# Patient Record
Sex: Male | Born: 1950 | Race: White | Hispanic: No | Marital: Married | Smoking: Never smoker
Health system: Southern US, Community
[De-identification: ages and names within clinical notes are randomized; demographics above are authoritative.]

## PROBLEM LIST (undated history)

## (undated) DIAGNOSIS — R7989 Other specified abnormal findings of blood chemistry: Secondary | ICD-10-CM

## (undated) DIAGNOSIS — R5383 Other fatigue: Secondary | ICD-10-CM

## (undated) DIAGNOSIS — B54 Unspecified malaria: Secondary | ICD-10-CM

## (undated) DIAGNOSIS — R972 Elevated prostate specific antigen [PSA]: Secondary | ICD-10-CM

## (undated) DIAGNOSIS — I471 Supraventricular tachycardia, unspecified: Secondary | ICD-10-CM

## (undated) DIAGNOSIS — E785 Hyperlipidemia, unspecified: Secondary | ICD-10-CM

## (undated) DIAGNOSIS — K219 Gastro-esophageal reflux disease without esophagitis: Secondary | ICD-10-CM

## (undated) DIAGNOSIS — I10 Essential (primary) hypertension: Secondary | ICD-10-CM

## (undated) DIAGNOSIS — I739 Peripheral vascular disease, unspecified: Secondary | ICD-10-CM

## (undated) DIAGNOSIS — I499 Cardiac arrhythmia, unspecified: Secondary | ICD-10-CM

## (undated) DIAGNOSIS — R001 Bradycardia, unspecified: Secondary | ICD-10-CM

## (undated) DIAGNOSIS — K828 Other specified diseases of gallbladder: Secondary | ICD-10-CM

## (undated) DIAGNOSIS — R609 Edema, unspecified: Secondary | ICD-10-CM

## (undated) DIAGNOSIS — M545 Low back pain, unspecified: Secondary | ICD-10-CM

## (undated) DIAGNOSIS — J189 Pneumonia, unspecified organism: Secondary | ICD-10-CM

## (undated) DIAGNOSIS — R945 Abnormal results of liver function studies: Secondary | ICD-10-CM

## (undated) HISTORY — PX: CYSTOSCOPY: SUR368

## (undated) HISTORY — DX: Bradycardia, unspecified: R00.1

## (undated) HISTORY — DX: Abnormal results of liver function studies: R94.5

## (undated) HISTORY — DX: Gastro-esophageal reflux disease without esophagitis: K21.9

## (undated) HISTORY — DX: Edema, unspecified: R60.9

## (undated) HISTORY — PX: COLONOSCOPY: SHX174

## (undated) HISTORY — DX: Other fatigue: R53.83

## (undated) HISTORY — PX: VASECTOMY: SHX75

## (undated) HISTORY — DX: Hyperlipidemia, unspecified: E78.5

## (undated) HISTORY — PX: SHOULDER ARTHROSCOPY: SHX128

## (undated) HISTORY — DX: Other specified diseases of gallbladder: K82.8

## (undated) HISTORY — DX: Supraventricular tachycardia, unspecified: I47.10

## (undated) HISTORY — DX: Supraventricular tachycardia: I47.1

## (undated) HISTORY — DX: Other specified abnormal findings of blood chemistry: R79.89

## (undated) HISTORY — DX: Unspecified malaria: B54

## (undated) HISTORY — PX: CARDIAC CATHETERIZATION: SHX172

---

## 1998-06-24 ENCOUNTER — Ambulatory Visit (HOSPITAL_COMMUNITY): Admission: RE | Admit: 1998-06-24 | Discharge: 1998-06-24 | Payer: Self-pay | Admitting: Orthopedic Surgery

## 1998-06-25 ENCOUNTER — Encounter: Payer: Self-pay | Admitting: Orthopedic Surgery

## 1998-06-26 ENCOUNTER — Encounter: Payer: Self-pay | Admitting: Orthopedic Surgery

## 1998-06-26 ENCOUNTER — Ambulatory Visit (HOSPITAL_COMMUNITY): Admission: RE | Admit: 1998-06-26 | Discharge: 1998-06-26 | Payer: Self-pay | Admitting: Orthopedic Surgery

## 1998-11-27 ENCOUNTER — Ambulatory Visit (HOSPITAL_COMMUNITY): Admission: RE | Admit: 1998-11-27 | Discharge: 1998-11-27 | Payer: Self-pay | Admitting: Radiology

## 1998-11-27 ENCOUNTER — Encounter: Payer: Self-pay | Admitting: Radiology

## 2001-02-28 ENCOUNTER — Ambulatory Visit (HOSPITAL_COMMUNITY): Admission: RE | Admit: 2001-02-28 | Discharge: 2001-02-28 | Payer: Self-pay | Admitting: Gastroenterology

## 2001-07-20 ENCOUNTER — Ambulatory Visit (HOSPITAL_COMMUNITY): Admission: RE | Admit: 2001-07-20 | Discharge: 2001-07-20 | Payer: Self-pay | Admitting: Orthopedic Surgery

## 2001-07-20 ENCOUNTER — Encounter: Payer: Self-pay | Admitting: Orthopedic Surgery

## 2002-09-18 ENCOUNTER — Encounter: Payer: Self-pay | Admitting: Cardiology

## 2002-09-18 ENCOUNTER — Observation Stay (HOSPITAL_COMMUNITY): Admission: EM | Admit: 2002-09-18 | Discharge: 2002-09-19 | Payer: Self-pay | Admitting: Emergency Medicine

## 2002-09-19 ENCOUNTER — Encounter (INDEPENDENT_AMBULATORY_CARE_PROVIDER_SITE_OTHER): Payer: Self-pay | Admitting: *Deleted

## 2003-08-04 ENCOUNTER — Encounter: Admission: RE | Admit: 2003-08-04 | Discharge: 2003-08-04 | Payer: Self-pay | Admitting: Internal Medicine

## 2004-11-17 ENCOUNTER — Ambulatory Visit (HOSPITAL_COMMUNITY): Admission: RE | Admit: 2004-11-17 | Discharge: 2004-11-17 | Payer: Self-pay | Admitting: Orthopedic Surgery

## 2005-01-07 ENCOUNTER — Ambulatory Visit (HOSPITAL_COMMUNITY): Admission: RE | Admit: 2005-01-07 | Discharge: 2005-01-07 | Payer: Self-pay | Admitting: Orthopedic Surgery

## 2005-01-07 ENCOUNTER — Ambulatory Visit (HOSPITAL_BASED_OUTPATIENT_CLINIC_OR_DEPARTMENT_OTHER): Admission: RE | Admit: 2005-01-07 | Discharge: 2005-01-07 | Payer: Self-pay | Admitting: Orthopedic Surgery

## 2006-04-05 ENCOUNTER — Ambulatory Visit (HOSPITAL_COMMUNITY): Admission: RE | Admit: 2006-04-05 | Discharge: 2006-04-05 | Payer: Self-pay | Admitting: Gastroenterology

## 2006-07-24 ENCOUNTER — Ambulatory Visit (HOSPITAL_COMMUNITY): Admission: RE | Admit: 2006-07-24 | Discharge: 2006-07-24 | Payer: Self-pay | Admitting: Urology

## 2007-11-14 ENCOUNTER — Ambulatory Visit (HOSPITAL_COMMUNITY): Admission: RE | Admit: 2007-11-14 | Discharge: 2007-11-14 | Payer: Self-pay | Admitting: Internal Medicine

## 2007-11-14 ENCOUNTER — Encounter (INDEPENDENT_AMBULATORY_CARE_PROVIDER_SITE_OTHER): Payer: Self-pay | Admitting: Internal Medicine

## 2007-11-14 ENCOUNTER — Ambulatory Visit: Payer: Self-pay | Admitting: Surgery

## 2008-04-14 ENCOUNTER — Encounter: Admission: RE | Admit: 2008-04-14 | Discharge: 2008-04-14 | Payer: Self-pay | Admitting: Orthopedic Surgery

## 2010-03-10 ENCOUNTER — Ambulatory Visit (HOSPITAL_COMMUNITY): Admission: RE | Admit: 2010-03-10 | Discharge: 2010-03-10 | Payer: Self-pay | Admitting: Anesthesiology

## 2010-03-10 ENCOUNTER — Encounter (INDEPENDENT_AMBULATORY_CARE_PROVIDER_SITE_OTHER): Payer: Self-pay | Admitting: Anesthesiology

## 2010-09-03 NOTE — Op Note (Signed)
NAME:  Randy Frye, Randy Frye                 ACCOUNT NO.:  [id number]   MEDICAL RECORD NO.:  [id number]          PATIENT TYPE:  OUT   LOCATION:  DFTL                         FACILITY:  [facility]   PHYSICIAN:  Feliberto Gottron. Turner Daniels, M.D.   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   PROCEDURE:  Left shoulder arthroscopic anterior inferior acromioplasty and  distal clavicle excision.   SURGEON:  Feliberto Gottron. Turner Daniels, M.D.   FIRST ASSISTANT:  None.   ANESTHETIC:  Left interscalene block plus general endotracheal.   ESTIMATED BLOOD LOSS:  Minimal.   FLUID REPLACEMENT:  800 mL of crystalloid.   DRAINS PLACED:  None.   TOURNIQUET TIME:  None.   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  Because of persistent pain, he desires elective acromioplasty for a type 3  subacromial spur and distal clavicle excision for AC joint arthritis.  Risks  and benefits of surgery discussed in advance, all questions answered.  The  patient prepared for surgery.   DESCRIPTION OF PROCEDURE:  The patient identified by armband, taken to the  block room at the day surgery center, where the appropriate anesthetic  monitors were attached and left interscalene block anesthesia induced.  He  was then taken to the operating room at [facility] day surgery center, where  general endotracheal anesthesia induced in the supine position, placed in a  beach  chair position, the left upper extremity prepped and draped in the  usual sterile fashion from the wrist to the hemithorax, and we began the  procedure by making standard portals 1.5 cm anterior to the [facility] joint and  lateral to the junction of the middle one-third , middle and posterior  thirds of the acromion and posterior to the posterolateral corner of the  acromion process.  The inflow was placed anteriorly, the arthroscope  laterally with gravity inflow, and a 4.2 great white sucker shaver  posteriorly, allowing subacromial bursectomy as an assistant applied  longitudinal traction to the arm.  No significant tearing was noted to the  rotator cuff.  The subacromial spur was identified and then removed with a  4.5 hooded vortex bur, followed by distal clavicle excision, bringing the  bur in from posterior and then after removing the distal inferior  centimeter, we swapped portals, bringing the bur anteriorly, the scope  posteriorly and the inflow laterally, completing the distal clavicle  excision.  The arthroscope was then repositioned into the glenohumeral joint  using the posterior portal, where diagnostic arthroscopy revealed normal  articular cartilage, normal labrum, biceps, biceps anchor and rotator cuff.  Photographic documentation was  made of these findings internal and external  to the cuff, the arthroscopic instruments removed, a dressing of Xeroform  4x4 dressing sponges and paper tape followed by a sling applied.  The  patient was then laid supine, awakened and taken to the recovery room  without difficulty.      Feliberto Gottron. Turner Daniels, M.D.  Electronically Signed     FJR/MEDQ  D:  01/31/2005  T:  01/31/2005  Job:  [id number]

## 2010-09-03 NOTE — H&P (Signed)
NAME:  Randy Frye, Randy Frye                           ACCOUNT NO.:  [id number]   MEDICAL RECORD NO.:  [id number]                   PATIENT TYPE:  EMS   LOCATION:  MAJO                                 FACILITY:  [facility]   PHYSICIAN:  Francisca December, M.D.               DATE OF BIRTH:  Aug 16, 1950   DATE OF ADMISSION:  09/18/2002  DATE OF DISCHARGE:                                HISTORY & PHYSICAL   ADMISSION DIAGNOSES:  1. Syncope.  2. Chest pain.   CHIEF COMPLAINT:  Syncopal episode in the operating room, chest discomfort.   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  Denies prior cardiac  history.  Reports feeling fine this morning.  While in the operating room  today, he had presyncopal (lightheaded with decreased visual clarity).  He  walked out of the OR and checked blood pressure which was 150/74, pulse 55-  60.  He hooked himself up to a telemetry monitor. As he did, he became more  presyncopal and the nurses observed him sit down, put his head back and  became less responsive.  Blood pressure at that time was 200/100.  Initially  the patient was in sinus rhythm in the 80s on the monitor while going out  with some PACs.  As he was lifted onto the gurney by the nurses, the monitor  revealed ST-T at a rate of 140, which lasted about one minute and  spontaneously resolved.  An EKG performed at that time showed sinus rhythm  with a ST depression.  He did have complaints of mild lower chest discomfort  and nausea during this episode.  The chest discomfort has continued.  The  patient said during the episode he could hear but could not open his eyes or  respond.  No loss of bowel or bladder and no seizure activity.   He states he has had several similar mild presyncopal episodes in the past  lasting about 10 seconds intermittently but none as severe as this.  No  prior episodes of chest pain.   ALLERGIES:  No known drug allergies.   MEDICATIONS:  Norvasc and Diovan.   PAST MEDICAL HISTORY:  1. Hypertension.  2. Internal hemorrhoids.  3. BPH.  4. Denies prior history of coronary disease.  5. The patient's lipid profile has been fine in the past.  6. He has had a vasectomy x2 in the past.   FAMILY HISTORY:  No coronary disease.  There is a family history of colon  cancer.   SOCIAL HISTORY:  The patient is married.  He is the father of three (ages  75, 77 and 11).  Wife is a Marine scientist and is currently on a mission trip to  [place].  He denies alcohol, tobacco or illicit drug use.  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   REVIEW OF SYSTEMS:  See HPI.  CHEST:  His chest pain is described as mild  across the lower chest with slight radiation to the left axillary region.  No significant shortness of breath, diaphoresis or nausea currently.  Chest  discomfort was relieved by sublingual nitroglycerin but returned within 5-10  minutes.  GI:  The patient denies GI bleed, melena, hematuria.  URINARY:  He  does have a slow urine stream due to BPH.   PHYSICAL EXAMINATION:  VITAL SIGNS:  Currently blood pressure is 150/86,  pulse of 67.  GENERAL:  The patient is alert and oriented x3, in no acute distress.  __________.  HEENT:  Normocephalic, atraumatic.  NECK:  Supple without bruits or masses.  LUNGS:  Clear to auscultation.  HEART:  Regular rate and rhythm without murmurs, gallops or rubs.  ABDOMEN:  Soft, nontender, nondistended.  Normal bowel sounds.  No  organomegaly.  EXTREMITIES:  With 2+ femoral pulses.  No bruits.  There are 2+ distal  pulses without edema.  NEUROLOGIC:  Nonfocal.  The patient intact (grossly and functionally).   LABORATORY DATA:  Labs are pending.  Chest x-ray is pending.  EKG is being  reviewed as we speak.   IMPRESSION:  1. Syncope.  2. Chest discomfort with abnormal EKG.   3. Hypertension.  4. Paroxysmal supraventricular tachycardia.  5. Benign prostatic hypertrophy.   PLAN:  1. Admit.  2. IV fluids.  3. Sublingual nitroglycerin.  4. 2-D echo.  5. Carotid Dopplers.  6. __________ cardiac enzymes.  7. Labs.   The patient and Dr. Amil Amen have talked and Dr. Amil Amen recommends  proceeding with cardiac catheterization to define the coronary anatomy given  ongoing recurrent chest pain and abnormal EKG, etc.  Consult has been called  with Dr. Anne Hahn, who has been down to see the patient.      Georgiann Cocker Jernejcic, P.A.                   Francisca December, M.D.    TCJ/MEDQ  D:  09/18/2002  T:  09/18/2002  Job:  [id number]   cc:   Dr. Anne Hahn

## 2010-09-03 NOTE — Procedures (Signed)
[place]  Patient:    Randy Frye, BASSO Visit Number: [id number] MRN: [id number]          Service Type: END Location: ENDO Attending Physician:  Orland Mustard Dictated by:   Llana Aliment. Randa Evens, M.D. Proc. Date: 02/28/01 Admit Date:  02/28/2001   CC:         Fayrene Fearing C. Earl Gala, M.D.  Cliffton Asters Ivin Booty, M.D.   Procedure Report  PROCEDURE PERFORMED:  Colonoscopy.  ENDOSCOPIST:  Llana Aliment. Randa Evens, M.D.  MEDICATIONS USED:  Fentanyl 100 mcg, Versed 10 mg IV.  INSTRUMENT:  Olympus pediatric adjustable colonoscope.  INDICATIONS:  The patient is a 60 year old male who has had previous colonoscopy approximately four years ago.  He has a strong family history of colon polyps and cancer.  He had recent rectal bleeding and had some rectal bleeding in the past that was due to hemorrhoids.  Due to the fact that he had some rectal bleeding and was nearly due for follow-up colonoscopy, we went ahead and scheduled it at this time.  DESCRIPTION OF PROCEDURE:  The procedure had been explained to the patient and consent obtained.  With the patient in the left lateral decubitus position, the Pediatric Olympus video colonoscope was inserted and advanced under direct visualization.  The adjustable scope was used and we were able to advance around the colon using.  Using position change and abdominal pressure, the ileocecal valve and appendiceal orifice were seen.  The scope was withdrawn. The cecum, ascending colon, hepatic flexure, transverse colon, splenic flexure, descending and sigmoid colon were seen well upon withdrawal.  No polyps or other lesions were seen.  Internal hemorrhoids were seen in the rectum.  There was no significant diverticular disease.  Scope withdrawn, patient tolerated the procedure well.  ASSESSMENT: 1. Internal hemorrhoids probably the source of her rectal bleeding. 2. Family history of polyps and gastrointestinal neoplasm.  PLAN:   Will recommend routine 5-year repeat colonoscopy and give hemorrhoid sheet, high fiber diet etc. Dictated by:   Llana Aliment. Randa Evens, M.D. Attending Physician:  Orland Mustard DD:  02/28/01 TD:  02/28/01 Job: [id number]

## 2010-09-03 NOTE — Consult Note (Signed)
NAME:  Randy Frye, Randy Frye                           ACCOUNT NO.:  [id number]   MEDICAL RECORD NO.:  [id number]                   PATIENT TYPE:  EMS   LOCATION:  MAJO                                 FACILITY:  [facility]   PHYSICIAN:  Marlan Palau, M.D.               DATE OF BIRTH:  [birth year]   DATE OF CONSULTATION:  09/18/2002  DATE OF DISCHARGE:                                   CONSULTATION   NEUROLOGY CONSULTATION   HISTORY OF PRESENT ILLNESS:  Cliffton Asters. 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  Usually the episodes are quite brief, lasting only a few  moments.  The patient had a similar type of event today with shortness of  breath, dizziness, intermittent visual dimming that occurred while at work.  The patient began to feel increasing more dizzy, was noting generalized  fatigue, weakness, was shuffling when walking but was able to hook himself  up to a cardiac monitor.  Systolic blood pressure was 150 initially.  The  patient was noted to have some brief episodes of SVT.  The patient began to  feel presyncopal with visual dimming, leaned his head back in the chair and,  although did not lose consciousness completely, was unable to respond  verbally to folks around him, felt very weak, drained.  The patient then  came to and was sent to the emergency room.  The patient has had some  problems with shortness of breath, some chest discomfort off and on in the  emergency room that has been questionably responsive to sublingual  nitroglycerin.  Of note, in this patient's history, he has noted a whooshing  noise in the left ear while lying down trying to sleep at night that has  occurred over the last three months.  The patient denies any focal numbness  or  weakness on the face, arms or legs.  Denies any double vision.  The  patient did have some dimming of his vision, as above.   PAST MEDICAL HISTORY:  The patient's past medical history is significant  for:  1. History of hypertension.  2. Cystoscopy in the past.  3. History of vasectomy X2.  4. Near syncopal event as above.   MEDICATIONS:  Include  1. Norvasc 5 mg daily.  2. Diovan 160 mg daily.  3. Aspirin 81 mg daily.  4. Multivitamin one daily.  5. Vitamin E.   ALLERGIES:  Patient has no known drug allergies.   SOCIAL HISTORY:  The patient does not smoke or drink.  The patient is  married.  He works as an Designer, industrial/product at [employer].  He has three children who  are alive and well.  Lives in the [place] area.   FAMILY HISTORY:  Mother is alive with a history of diabetes.  Father is  alive with hypertension.  Patient has no brothers or sisters with the  exception of an adopted brother.   REVIEW OF SYMPTOMS:  Notable for no fevers or chills.  Patient does note  some shortness of breath.  Denies cough.  Chest pain today.  Patient has had  some nausea, no vomiting.  Denies any problems with control of bowels or  bladder.  Has had dizziness as above.   PHYSICAL EXAMINATION:  VITAL SIGNS:  Blood pressure is currently 141/94,  initially was 171/100.  Heart rate 71.  Respiratory rate 20.  Temperature  afebrile.  GENERAL:  This patient is a well-developed white male who is alert and  cooperative at the time of examination.  HEENT:  Head is atraumatic.  Eyes: pupils equal, round, reactive to light.  Discs are flat bilaterally.  NECK:  Supple.  No carotid bruits noted.  RESPIRATORY:  Examination is clear.  CARDIOVASCULAR:  Reveals a regular rate and rhythm.  No obvious murmurs or  rubs are noted.  EXTREMITIES:  Are without significant edema.  NEUROLOGICAL:  Cranial nerves II-XII as above.  Facial symmetry is present.  Patient has good sensation to pinprick  and soft touch bilaterally.  He has  good strength of all four extremities.  Good symmetric motor tones noted  throughout.  Extraocular movements again were full.  Visual fields are full,  double simultaneous stimulation.  Patient has good finger to nose, finger to  toe to finger bilaterally.  Has questionable drift on the right upper  extremity.  Deep tendon reflexes remain symmetric, normal toes, 0 to neutral  bilaterally.  Again, pinprick, soft touch, vibratory sensation of all four  extremities were normal.   LABORATORY DATA:  Laboratory values are notable for a white blood cell count  of 4.6, hemoglobin 15.1, hematocrit 42.3, platelet count 195,000, MCV 87.9.  Sodium 141, potassium 4.0, chloride of 103, cO2 33, glucose 127.  BUN 16,  creatinine 1.0, calcium 9.7, total protein 7.5, albumin 4.5, AST 26, ALT 24,  alkaline phosphatase 52, total bilirubin 0.9.  INR 0.9.   IMPRESSION:  1. History of near syncope, some supraventricular tachycardia on cardiac     monitor.  2. History of hypertension.   This patient has had an event of near syncope today.  The event is most  consistent with a hypotensive spell by history with the dizziness, visual  dimming, generalized fatigue, overwhelming in nature and no focal symptoms  of numbness or weakness on the face, arms or legs.  This patient, however,  did not at any time have documented hypotension.  Will pursue with further  work up to rule out intracranial cerebrovascular disease in this case.   PLAN:  1. MRI of the brain.  2.     MRA angiogram of intracranial and extracranial vessels.  3. Will follow his clinical course while in house.  4. Patient has been seen by Dr. Amil Amen who will be initiating a cardiac     evaluation.                                               Marlan Palau, M.D.    CKW/MEDQ  D:  09/18/2002  T:  09/18/2002  Job:  [id number]  cc:   Francisca December, M.D.  [address]  [address]  [place]  [place]  [address]  Fax: [phone]   Theressa Millard, M.D.  [address] [place]  [place] [address]  Fax: [phone]

## 2010-09-03 NOTE — Discharge Summary (Signed)
NAME:  Randy Frye, Randy Frye                           ACCOUNT NO.:  [id number]   MEDICAL RECORD NO.:  [id number]                   PATIENT TYPE:  INP   LOCATION:  2010                                 FACILITY:  [facility]   PHYSICIAN:  Francisca December, M.D.               DATE OF BIRTH:  08/10/1950   DATE OF ADMISSION:  09/18/2002  DATE OF DISCHARGE:  09/19/2002                                 DISCHARGE SUMMARY   ADMISSION DIAGNOSES:  1. Near syncope.  2. Chest pain.  3. Hypertension.  4. Paroxysmal supraventricular tachycardia.   DISCHARGE DIAGNOSES:  1. Near syncope, no recurrence.  2. Chest pain, resolved.  Catheterization revealing no obstructive coronary     artery disease.  3. Hypertension.  4. Paroxysmal supraventricular tachycardia.   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  Denies prior cardiac  history.  Reports feeling fine this morning.  While in the operating room  today, he had a presyncopal episode - walked out of the OR and checked his  blood pressure which was 150/74 with a pulse of 55 to 60.  He hooked himself  up to telemetry and then he became more ______ of the nurses, etc, then  sitting down and put his head back. Blood pressure at that time was 200/100.  Initially rhythm was normal sinus rhythm in the 80's on the monitor with  some PAC's. He did have a brief episode of SVT at 140 which spontaneously  resolved. EKG at the time showed normal sinus rhythm with question low  anterior ST depression at that time.  The patient did have some mild lower  chest pain and nausea during this episode.  No loss of bowel, bladder, or  seizure activity.   He has had history of presyncopal episodes in the past lasting about 10  seconds intermittently.   ASSESSMENT AND PLAN:  1. The patient will be admitted for near syncope, no true chest pain, rule     out myocardial infarction.  2   Hypertension.  1. PFCT.  2. Benign prostatic hypertrophy.   We will treat with IV fluids.  Will get a two-dimensional echocardiogram and  carotid Dopplers and Dr. Amil Amen will perform cardiac catheterization. WE  will also get a consult from neurology.   PROCEDURE:  Cardiac catheterization on September 18, 2002, by Dr. Amil Amen.  Complications were none.   CONSULTATIONS:  Neurology.   HOSPITAL COURSE:  The patient was admitted to [facility] on September 18, 2002, with a near syncopal spell.  The patient remained on telemetry during  the hospitalization and did not show any recurrent episodes of SVT.   Admission laboratory studies showed a WBC of 4.6, hemoglobin 15.1, and  platelets of 195.  INR was 0.9.  Sodium 141, potassium 4.0, BUN 16,  creatinine 1.0.  LFT was within normal limits. Cardiac enzymes were negative.  TSH was 0.841.   An MRI of the brain and MRA of the head and neck were performed and this  revealed opacification inferolaterally of the left sphenoid sinus. No  evidence of infarct or enhancing lesion.  There was mild narrowing of the  proximal right internal carotid artery without hemodynamically significant  stenosis.  There was slightly lobulated contour of the distal vertebral  arteries bilaterally extending into the basilar artery, possible artifactual  in origin, but limited evaluation subtle abnormalities such as underlying  vasculitis.  There was no significant stenosis along the anterior  circulation.   Carotid Dopplers revealed no evidence of ICA stenosis.  Vertebral artery  flow as antegrade.   Cardiac catheterization was performed by Dr. Amil Amen on September 18, 2002.  This  revealed an EF of 69%, LAD with luminal irregularities.  Circumflex without  significant disease.  The RCA had an anterior origin and small distal vessel  disease of the PDA, question vasospasm.  Dr. Amil Amen felt that the patient  had no significant obstructive disease.  Normal LV function.  Suspect that  his symptoms may have come from some vasospasm.  Recommended continuing  nitroglycerin for a total of 18 hours.   Dr. Anne Hahn was consulted with neurology for the patient's symptom complex.  He felt that the patient's symptoms were most consistent with a hypertensive  spell with history of dizziness, visual dimming, generalized fatigue, and no  focal symptoms from the face, arms, or legs.   A two-dimensional echocardiogram was ordered, but the results are pending at  the time of this discharge.   The patient tolerated the cardiac catheterization well.  He had no recurrent  symptoms of chest pain or syncope.   Dr. Amil Amen felt the patient was stable for discharge to home on September 19, 2002.   DISCHARGE MEDICATIONS:  1. Metoprolol 25 mg twice a day for two weeks.  2. _________ 5 mg a day for two weeks.  3. Atenolol 25 mg twice a day for two weeks (the patient is to try each of     these medicines for two weeks alone and see if he tolerates one better     than the other. In the past he has had problems with impotent beta     blocker therapy).  4. Enteric-coated aspirin 81 mg a day.  5. Diovan 160 mg a day.  6. Norvasc 5 mg a day.  7. Multivitamins.   ACTIVITY:  As before. No strenuous activity for two days.   DIET:  As before.   WOUND CARE:  The patient may shower, but he should not soak in a tub for  four days.   FOLLOW UP:  He should call to schedule a follow-up appointment with Dr.  Amil Amen and Theressa Millard, M.D.     Georgiann Cocker Jernejcic, P.A.                   Francisca December, M.D.    TCJ/MEDQ  D:  09/30/2002  T:  09/30/2002  Job:  [id number]   cc:   Theressa Millard, M.D.  [address] [place]  [place] [address]  Fax: [phone]   Francisca December, M.D.  [address]  [address]  [place]  [place] [address]  Fax: [phone]    cc:   Theressa Millard, M.D.  [address] [place]  [place] [address]  Fax: [phone]  Francisca December, M.D. [address]  [place]  [place] [address]  Fax: [phone]

## 2010-09-03 NOTE — Op Note (Signed)
NAME:  Randy Frye, Randy Frye                 ACCOUNT NO.:  [id number]   MEDICAL RECORD NO.:  [id number]          PATIENT TYPE:  AMB   LOCATION:  ENDO                         FACILITY:  [facility]   PHYSICIAN:  James L. Malon Kindle., M.D.DATE OF BIRTH:  [birth year]   DATE OF PROCEDURE:  04/05/2006  DATE OF DISCHARGE:                               OPERATIVE REPORT   SURGEON:  Fayrene Fearing L. Randa Evens, M.D.   PROCEDURE:  Colonoscopy.   MEDICATIONS:  Administered by Anesthesia.  The patient received fentanyl  100 mcg and propofol 250 mg IV.   SCOPE:  Pentax adjustable adult scope.   INDICATIONS:  Strong family history of colon polyps in his father.  This  is done as a 5-year followup.   DESCRIPTION OF PROCEDURE:  The procedure had been explained to the  patient and consent obtained.  In the left lateral decubitus position,  the patient was standard by Anesthesia, and the scope inserted following  digital rectal exam.  The prep was excellent.  He had a somewhat  tortuous colon, and required using abdominal pressure and placing him in  the supine, and finally in the right lateral decubitus position, and we  were able to advance down to the cecum.  The ileocecal valve was seen,  as was the appendiceal orifice, and these were photographed for  documentation.  The scope was withdrawn.  The terminal ileum was entered  for approximately 3 to 4 cm and was normal.  The cecum, ascending,  transverse, discomfort and sigmoid colon were seen well.  No polyps were  seen.  There was no diverticular disease.  In the rectum, the scope was  retroflexed with the finding of some internal hemorrhoids. They were not  bleeding and were relatively small.  The patient tolerated the procedure  well and was resting comfortably at the termination of the procedure.  There were no immediate complications.   ASSESSMENT:  1. Family history of colon polyps with negative colonoscopy at this      time.  2. Internal hemorrhoids.   PLAN:   Routine followup with a 5-year repeat colonoscopy recommended.           ______________________________  Llana Aliment. Malon Kindle., M.D.     Waldron Session  D:  04/05/2006  T:  04/05/2006  Job:  [id number]   cc:   Theressa Millard, M.D.  Sheldon Silvan, M.D.

## 2010-09-03 NOTE — Cardiovascular Report (Signed)
NAME:  Randy Frye, Randy Frye                           ACCOUNT NO.:  [id number]   MEDICAL RECORD NO.:  [id number]                   PATIENT TYPE:  INP   LOCATION:  1825                                 FACILITY:  [facility]   PHYSICIAN:  Francisca December, M.D.               DATE OF BIRTH:  [birth year]   DATE OF PROCEDURE:  09/18/2002  DATE OF DISCHARGE:                              CARDIAC CATHETERIZATION   PROCEDURE PERFORMED:  1. Left heart catheterization.  2. Coronary angiography.  3. Left ventriculogram.   INDICATIONS:  The patient is a 60 year old man who this morning experienced  a syncopal episode.  This was associated either at the time or shortly  thereafter by a short run of SVT and hypertension.  He then began to develop  mid to across the chest and left-sided discomfort characterized as a  pressure.  There was associated nausea and diaphoresis and initial ECG  revealed the presence of slight ST segment depression inferiorly that  resolved with subsequent tracings.  He is brought now to the catheterization  laboratory to identify possible significant CAD as an etiology for his  ongoing chest discomfort.  It was responsive to nitroglycerin.   PROCEDURAL NOTE:  The patient was brought to the cardiac catheterization  laboratory in a stable condition where the right groin was prepped and  draped in the usual sterile fashion.  Local anesthesia was obtained with the  infiltration of 1% lidocaine.  A 6-French catheter sheath was inserted  percutaneously into the right femoral artery utilizing an anterior approach  over a guiding J-wire.  A 110 cm pigtail catheter was then used to measure  pressures in the ascending aorta and in the left ventricle both prior to and  following the ventriculogram.  A 30 degree RAO cineangiogram left  ventriculogram was performed using a power injector.  Coronary angiography  was then performed using 6-French #4 left and right Judkins catheters.  Cineangiography of each coronary artery was conducted in multiple LAO and  RAO projections.  All catheter manipulations were performed using  fluoroscopic observation and exchanges performed over long guiding J-wire.  At the completion of the procedure the catheter and catheter sheath was  removed.  Hemostasis was achieved by direct pressure.  The patient was  transported to the recovery area in stable condition with an intact distal  pulse.   HEMODYNAMICS:  Systemic arterial pressure was 111/70 with a mean of 90 mmHg.  There was no systolic gradient across the aortic valve.  The left  ventricular end-diastolic pressure was 11 mmHg pre and post ventriculogram.   ANGIOGRAPHY:  The left ventriculogram demonstrated normal chamber size and  normal global systolic function without regional wall motion abnormality.  The calculated ejection fraction utilizing a single plane cineangiogram  method was 69%.  There was no mitral regurgitation nor was there coronary  calcification seen.  The aortic valve was trileaflet  and had normal leaflet  movement.   There was a right dominant coronary system present.  The main left coronary  artery was short and normal.   The left anterior descending artery and its branches showed only luminal  irregularities.  Two small to moderate sized diagonal branches arise without  significant obstruction.  The ongoing anterior descending artery reaches and  traverses the apex.  There are no significant obstruction of the diagonal  branches.  The left circumflex coronary artery and its branches were normal.  There is a ramus intermedius which is moderate to large in size and has no  obstruction.  The left circumflex gives rise to a single large marginal  branch which is a dominant vessel on the inferolateral wall of the heart.  The ongoing circumflex gives rise to only a small posterolateral branch.  Again, there are no obstructions seen in this vessel.   The right  coronary artery and its branches also are without significant  obstruction.  It does have an anterior origin and is without even luminal  irregularity in the proximal mid and distal portion.  The distal portion  bifurcates into a very small posterior descending artery and small  posterolateral branch and segment.   Collateral vessels are not seen.   FINAL IMPRESSION:  1. Normal left ventricular size and global systolic function.  2. Intimal plaquing likely present in the anterior descending artery.     Certainly no obstructive coronary artery disease present.  3. The possibility of spasm in the right coronary must be entertained based     on his clinical presentation, electrocardiogram, and angiographic     findings.  No specific or focal coronary spasm is seen in this study.     The posterior descending artery may be demonstrating some diffuse spasm.   PLAN/RECOMMENDATION:  1. Telemetry observation for 12-18 hours.  2. Increase Norvasc 5-10 mg p.o. daily.  3. Continue IV nitroglycerin for 18 hours.  4. The patient may be benefited by the addition of a beta blocker to his     antihypertensive regimen give his propensity for SVT.                                               Francisca December, M.D.    JHE/MEDQ  D:  09/18/2002  T:  09/18/2002  Job:  [id number]   cc:   Theressa Millard, M.D.  [address] [place]  [place] [address]  Fax: [phone]   C. Lesia Sago, M.D.  [address]  [address]  [place]  [place] [address]  Fax: [phone]   Cardiac Cath Lab

## 2010-11-25 ENCOUNTER — Emergency Department (HOSPITAL_COMMUNITY): Payer: BC Managed Care – PPO

## 2010-11-25 ENCOUNTER — Emergency Department (HOSPITAL_COMMUNITY)
Admission: EM | Admit: 2010-11-25 | Discharge: 2010-11-26 | Disposition: A | Discharge status: Home / Self Care | Payer: BC Managed Care – PPO | Attending: Emergency Medicine | Admitting: Emergency Medicine

## 2010-11-25 DIAGNOSIS — S01119A Laceration without foreign body of unspecified eyelid and periocular area, initial encounter: Secondary | ICD-10-CM | POA: Insufficient documentation

## 2010-11-25 DIAGNOSIS — W1809XA Striking against other object with subsequent fall, initial encounter: Secondary | ICD-10-CM | POA: Insufficient documentation

## 2010-11-25 DIAGNOSIS — I10 Essential (primary) hypertension: Secondary | ICD-10-CM | POA: Insufficient documentation

## 2010-11-29 NOTE — Consult Note (Signed)
  NAME:  Randy Frye, Randy Frye NO.:  [id number]  MEDICAL RECORD NO.:  [id number]  LOCATION:  [facility]                         FACILITY:  [facility]  PHYSICIAN:  Melvenia Beam, MD      DATE OF BIRTH:  [birth year]  DATE OF CONSULTATION:11/26/2010 DATE OF DISCHARGE:11/26/2010                                CONSULTATION   CONSULTING PHYSICIAN:  Dione Booze, MD  REASON FOR CONSULTATION:  A 2-cm right infraorbital right lower lid laceration.  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  He sustained a laceration. He had a maxillofacial CT that I personally reviewed.  This demonstrated no evidence of any opacification of paranasal sinuses.  No evidence of any maxillofacial fractures and just a very mild soft tissue trauma in the right lower lid corresponding to his visible laceration.  PAST MEDICAL AND SURGICAL HISTORY:  Reviewed on the chart.  ALLERGIES:  Reviewed on the chart.  PHYSICAL EXAMINATION:  He is atraumatic, normocephalic.  He does have a horizontally oriented right lateral lower lid laceration just inferior to the lash line extending from the mid pupillary area over to the right lateral canthus.  This is superficial with no involvement of the orbicularis oculi.  Extraocular movements are intact.  Pupils equal, round, reactive to light and accommodation.  There is no evidence of any entrapment.  Cranial nerves II through XII grossly intact, symmetric bilaterally.  Oral cavity reveals class I maxillomandibular occlusion, normal-appearing tongue, normal-appearing posterior pharynx.  Neck is supple with no masses or lesions.  Trachea is midline.  Tympanic membranes are flat and clear bilaterally.  Procedure note 12001, simple repair of right lower lid laceration. After cleaning the area gently with saline, the wound was approximated with approximately five interrupted simple Vicryl Rapide sutures  to reapproximate the skin layer.  No evidence of any dehiscence was noted after repair.  The patient tolerated the procedure well with no immediate complications.  ANESTHESIA:  Local with 1% lidocaine with epinephrine.  COMPLICATIONS:  None.  BLOOD LOSS:  Minimal.  ASSESSMENT AND PLAN:  Status post repair of right lower lid 2-cm laceration.  The sutures were absorbable they should dissolve on their own.  I wrote him a prescription for bacitracin ophthalmic that he can use at home 2-3 times daily.  He should keep the laceration dry for 2 days and then can wash this gently.  He can see [place] back in clinic in 1 week for wound check or sooner if he has any problems.          ______________________________ Melvenia Beam, MD     MG/MEDQ  D:  11/26/2010  T:  11/26/2010  Job:  [id number]  Electronically Signed by Melvenia Beam MD on 11/29/2010 10:28:33 AM

## 2011-03-31 ENCOUNTER — Encounter (HOSPITAL_COMMUNITY): Payer: Self-pay

## 2011-03-31 ENCOUNTER — Encounter (HOSPITAL_COMMUNITY)
Admission: RE | Admit: 2011-03-31 | Discharge: 2011-03-31 | Disposition: A | Discharge status: Home / Self Care | Payer: BC Managed Care – PPO | Source: Ambulatory Visit | Attending: Gastroenterology | Admitting: Gastroenterology

## 2011-03-31 ENCOUNTER — Encounter (HOSPITAL_COMMUNITY): Payer: Self-pay | Admitting: Pharmacy Technician

## 2011-03-31 ENCOUNTER — Other Ambulatory Visit: Payer: Self-pay

## 2011-03-31 HISTORY — DX: Essential (primary) hypertension: I10

## 2011-03-31 HISTORY — DX: Elevated prostate specific antigen (PSA): R97.20

## 2011-03-31 HISTORY — DX: Pneumonia, unspecified organism: J18.9

## 2011-03-31 HISTORY — DX: Cardiac arrhythmia, unspecified: I49.9

## 2011-03-31 HISTORY — DX: Low back pain: M54.5

## 2011-03-31 HISTORY — DX: Low back pain, unspecified: M54.50

## 2011-03-31 HISTORY — DX: Peripheral vascular disease, unspecified: I73.9

## 2011-03-31 LAB — CBC
Hemoglobin: 15.4 g/dL (ref 13.0–17.0)
MCHC: 37.4 g/dL — ABNORMAL HIGH (ref 30.0–36.0)
MCV: 86.6 fL (ref 78.0–100.0)
Platelets: 160 10*3/uL (ref 150–400)
RDW: 12.3 % (ref 11.5–15.5)
WBC: 5.1 10*3/uL (ref 4.0–10.5)

## 2011-03-31 LAB — COMPREHENSIVE METABOLIC PANEL
AST: 32 U/L (ref 0–37)
Albumin: 4.3 g/dL (ref 3.5–5.2)
Alkaline Phosphatase: 62 U/L (ref 39–117)
BUN: 15 mg/dL (ref 6–23)
Calcium: 9.5 mg/dL (ref 8.4–10.5)
GFR calc Af Amer: >90 mL/min (ref >90)
Glucose, Bld: 105 mg/dL — ABNORMAL HIGH (ref 70–99)
Potassium: 3.9 mEq/L (ref 3.5–5.1)
Total Bilirubin: 0.9 mg/dL (ref 0.3–1.2)

## 2011-03-31 NOTE — Progress Notes (Signed)
H & P from Dr. Randa Evens office on chart.

## 2011-03-31 NOTE — Progress Notes (Signed)
No chest xray needed for procedure on 04/01/2011 per Dr. Sharol Harness.

## 2011-03-31 NOTE — Progress Notes (Signed)
Contacted Dr.  Randa Evens office, spoke with Marchelle Folks, requested consent order for PAT.

## 2011-03-31 NOTE — Pre-Procedure Instructions (Signed)
[address] YOSHI MANCILLAS  03/31/2011   Your procedure is scheduled on:  Friday April 01, 2011  Report to [facility] at 1130 AM.  Call this number if you have problems the morning of surgery: [phone]   Remember:   Do not eat food:After Midnight.  May have clear liquids: up to 4 Hours before arrival.(7:30am)  Clear liquids include soda, tea, black coffee, apple or grape juice, broth.  Take these medicines the morning of surgery with A SIP OF WATER: amlodipine, diovan,hydrocholorthiazide ( per Dr Ivin Booty)   Do not wear jewelry, make-up or nail polish.  Do not wear lotions, powders, or perfumes. You may wear deodorant.  Do not shave 48 hours prior to surgery.  Do not bring valuables to the hospital.  Contacts, dentures or bridgework may not be worn into surgery.  Leave suitcase in the car. After surgery it may be brought to your room.  For patients admitted to the hospital, checkout time is 11:00 AM the day of discharge.   Patients discharged the day of surgery will not be allowed to drive home.  Name and phone number of your driver: Achille Xiang [phone]  Special Instructions: CHG Shower Use Special Wash: 1/2 bottle night before surgery and 1/2 bottle morning of surgery.   Please read over the following fact sheets that you were given: Pain Booklet, Coughing and Deep Breathing, MRSA Information and Surgical Site Infection Prevention

## 2011-04-01 ENCOUNTER — Encounter (HOSPITAL_COMMUNITY)
Admission: RE | Disposition: A | Discharge status: Home / Self Care | Payer: Self-pay | Source: Ambulatory Visit | Attending: Gastroenterology

## 2011-04-01 ENCOUNTER — Ambulatory Visit (HOSPITAL_COMMUNITY)
Admission: RE | Admit: 2011-04-01 | Discharge: 2011-04-01 | Disposition: A | Discharge status: Home / Self Care | Payer: BC Managed Care – PPO | Source: Ambulatory Visit | Attending: Gastroenterology | Admitting: Gastroenterology

## 2011-04-01 ENCOUNTER — Encounter (HOSPITAL_COMMUNITY): Payer: Self-pay | Admitting: Anesthesiology

## 2011-04-01 ENCOUNTER — Encounter (HOSPITAL_COMMUNITY): Payer: Self-pay | Admitting: Gastroenterology

## 2011-04-01 ENCOUNTER — Encounter (HOSPITAL_COMMUNITY): Payer: Self-pay

## 2011-04-01 DIAGNOSIS — Z01810 Encounter for preprocedural cardiovascular examination: Secondary | ICD-10-CM | POA: Insufficient documentation

## 2011-04-01 DIAGNOSIS — Z01812 Encounter for preprocedural laboratory examination: Secondary | ICD-10-CM | POA: Insufficient documentation

## 2011-04-01 DIAGNOSIS — K648 Other hemorrhoids: Secondary | ICD-10-CM | POA: Insufficient documentation

## 2011-04-01 DIAGNOSIS — K5730 Diverticulosis of large intestine without perforation or abscess without bleeding: Secondary | ICD-10-CM | POA: Insufficient documentation

## 2011-04-01 DIAGNOSIS — Z8371 Family history of colonic polyps: Secondary | ICD-10-CM | POA: Insufficient documentation

## 2011-04-01 DIAGNOSIS — K644 Residual hemorrhoidal skin tags: Secondary | ICD-10-CM | POA: Insufficient documentation

## 2011-04-01 DIAGNOSIS — Z83719 Family history of colon polyps, unspecified: Secondary | ICD-10-CM | POA: Insufficient documentation

## 2011-04-01 DIAGNOSIS — Z1211 Encounter for screening for malignant neoplasm of colon: Secondary | ICD-10-CM | POA: Insufficient documentation

## 2011-04-01 HISTORY — PX: COLONOSCOPY: SHX5424

## 2011-04-01 SURGERY — COLONOSCOPY
Anesthesia: Monitor Anesthesia Care

## 2011-04-01 NOTE — Preoperative (Signed)
Beta Blockers   Reason not to administer Beta Blockers:Not Applicable 

## 2011-04-01 NOTE — Anesthesia Postprocedure Evaluation (Signed)
  Anesthesia Post-op Note  Patient: Randy Frye  Procedure(s) Performed:  COLONOSCOPY  Patient Location: PACU and Short Stay  Anesthesia Type: MAC  Level of Consciousness: awake, alert  and oriented  Airway and Oxygen Therapy: Patient Spontanous Breathing  Post-op Pain: none  Post-op Assessment: Post-op Vital signs reviewed, Patient's Cardiovascular Status Stable, Respiratory Function Stable, Patent Airway, No signs of Nausea or vomiting, Adequate PO intake and Pain level controlled  Post-op Vital Signs: Reviewed and stable  Complications: No apparent anesthesia complications

## 2011-04-01 NOTE — Op Note (Signed)
[facility] [address] [place], [place]  [address]  COLONOSCOPY PROCEDURE REPORT  PATIENT:  Randy Frye, Randy Frye  MR#:  [id number] BIRTHDATE:  [birth year], 60 yrs. old  GENDER:  male ENDOSCOPIST:  Vida Rigger, MD REF. BY:  Theressa Millard, M.D. PROCEDURE DATE:  04/01/2011 PROCEDURE:  Colonoscopy 19147 ASA CLASS:  Class I INDICATIONS:  family history colon polyps MEDICATIONS:  None  DESCRIPTION OF PROCEDURE:   After the risks benefits and alternatives of the procedure were thoroughly explained, informed consent was obtained.  The EC-[id number] ([id number]) endoscope was introduced through the anus and advanced to the cecum, with abdominal pressure only .The quality of the prep was adequate.. The instrument was then slowly withdrawn as the colon was fully examined. <<PROCEDUREIMAGES>>  FINDINGS: 1. Tiny internal/external hemorrhoids 2. Rare sigmoid and hepatic flexure small diverticuli 3. Otherwise within normal limits to the cecum without any polyps seen  COMPLICATIONS:  None  IMPRESSION: Tiny hemorrhoids and rare diverticuli only  RECOMMENDATIONS: Repeat colonoscopy in 5 years GI followup when necessary  ______________________________ Vida Rigger, MD  CC:  Theressa Millard, MD  n. Rosalie DoctorVida Rigger at 04/01/2011 01:57 PM  Sheldon Silvan, [phone]

## 2011-04-01 NOTE — Anesthesia Preprocedure Evaluation (Addendum)
Anesthesia Evaluation  Patient identified by MRN, date of birth, ID band Patient awake    Reviewed: Allergy & Precautions, H&P , NPO status , Patient's Chart, lab work & pertinent test results  Airway Mallampati: II      Dental  (+) Teeth Intact   Pulmonary pneumonia ,  clear to auscultation        Cardiovascular hypertension, Pt. on medications + dysrhythmias Supra Ventricular Tachycardia Regular Normal    Neuro/Psych    GI/Hepatic   Endo/Other    Renal/GU      Musculoskeletal   Abdominal   Peds  Hematology   Anesthesia Other Findings   Reproductive/Obstetrics                          Anesthesia Physical Anesthesia Plan  ASA: II  Anesthesia Plan: MAC   Post-op Pain Management:    Induction: Intravenous  Airway Management Planned: Mask  Additional Equipment:   Intra-op Plan:   Post-operative Plan:   Informed Consent:   Dental advisory given  Plan Discussed with: CRNA, Anesthesiologist and Surgeon  Anesthesia Plan Comments:         Anesthesia Quick Evaluation

## 2011-04-01 NOTE — Transfer of Care (Signed)
Immediate Anesthesia Transfer of Care Note  Patient: Randy Frye  Procedure(s) Performed:  COLONOSCOPY  Patient Location: PACU  Anesthesia Type: MAC  Level of Consciousness: awake, alert  and oriented  Airway & Oxygen Therapy: Patient Spontanous Breathing and Patient connected to nasal cannula oxygen  Post-op Assessment: Report given to PACU RN  Post vital signs: Reviewed and stable  Complications: No apparent anesthesia complications

## 2011-04-04 ENCOUNTER — Encounter (HOSPITAL_COMMUNITY): Payer: Self-pay | Admitting: Gastroenterology

## 2012-11-12 ENCOUNTER — Other Ambulatory Visit (HOSPITAL_COMMUNITY): Payer: Self-pay | Admitting: Internal Medicine

## 2012-11-12 ENCOUNTER — Ambulatory Visit (HOSPITAL_COMMUNITY)
Admission: RE | Admit: 2012-11-12 | Discharge: 2012-11-12 | Disposition: A | Discharge status: Home / Self Care | Payer: BC Managed Care – PPO | Source: Ambulatory Visit | Attending: Internal Medicine | Admitting: Internal Medicine

## 2012-11-12 ENCOUNTER — Other Ambulatory Visit (HOSPITAL_COMMUNITY): Payer: Self-pay | Admitting: Unknown Physician Specialty

## 2012-11-12 DIAGNOSIS — I6529 Occlusion and stenosis of unspecified carotid artery: Secondary | ICD-10-CM

## 2012-11-12 DIAGNOSIS — R0989 Other specified symptoms and signs involving the circulatory and respiratory systems: Secondary | ICD-10-CM

## 2012-11-12 DIAGNOSIS — I658 Occlusion and stenosis of other precerebral arteries: Secondary | ICD-10-CM | POA: Insufficient documentation

## 2012-11-12 NOTE — Progress Notes (Signed)
VASCULAR LAB PRELIMINARY  PRELIMINARY  PRELIMINARY  PRELIMINARY  Carotid Dopplers completed.    Preliminary report:  There is 0-39% ICA stenosis.  Vertebral artery flow is antegrade.  Randy Frye, RVT 11/12/2012, 3:32 PM

## 2014-05-01 ENCOUNTER — Encounter (HOSPITAL_COMMUNITY): Payer: Self-pay | Admitting: Gastroenterology

## 2014-07-01 ENCOUNTER — Ambulatory Visit (INDEPENDENT_AMBULATORY_CARE_PROVIDER_SITE_OTHER): Payer: BLUE CROSS/BLUE SHIELD | Admitting: Cardiology

## 2014-07-01 ENCOUNTER — Encounter: Payer: Self-pay | Admitting: Cardiology

## 2014-07-01 VITALS — BP 145/75 | HR 59 | Ht 67.0 in | Wt 159.0 lb

## 2014-07-01 DIAGNOSIS — R5383 Other fatigue: Secondary | ICD-10-CM | POA: Insufficient documentation

## 2014-07-01 DIAGNOSIS — R011 Cardiac murmur, unspecified: Secondary | ICD-10-CM | POA: Insufficient documentation

## 2014-07-01 DIAGNOSIS — I471 Supraventricular tachycardia: Secondary | ICD-10-CM | POA: Insufficient documentation

## 2014-07-01 NOTE — Progress Notes (Signed)
Cardiology Office Note   Date:  07/01/2014   ID:  Randy Frye, DOB 09/16/1950, MRN [id number]  PCP:  Randy Cowman, MD  Cardiologist:   Randy Schultz, MD   Chief Complaint  Patient presents with  . Advice Only   Here for evaluation of possible supraventricular tachycardia at the request of Dr. Bosie Frye.     History of Present Illness: Randy Frye is a 64 y.o. male who presents for evaluation of near syncope. Had a previous episode several years ago of SVT. Had cardiac catheterization with Dr. Amil Frye which was unremarkable. 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 Felt extreme fatigue. Previously, lost consciousness and ended up in the PACU bed several years ago.  2004 had episode of "very dark" was placed on to bed in PACU. Could not see without tilting head back. Could not stand up. Nurse said she saw SVT. Went away, then ECG NSR. Flipped T waves . Went to cath. Normal, Dr. Amil Frye ?spasm. ?Repolarization abnormality.   Since than had u/s of neck small plaque. 30%.   Was on Bystolic for a while. Stopped it for fatigue. Been off for year.   06/10/14, had this 2-3 hrs period of feeling very tired in the morning at 10am. Felt like it may be return of SVT. Took BP with machine at work and BP was normal 140 SBP and heart rate on machine was 58bpm.  Non viral. After 3 hours gone. Trying to walk again. Weights once a week. Has not felt situation again.   He wonders sometimes about his valves. Hears sounds, systolic, grinding. Turn over in bed goes away.      Past Medical History  Diagnosis Date  . Hypertension   . Dysrhythmia   . Peripheral vascular disease     plaque in right carotid  . Pneumonia   . Low back pain   . Elevated PSA   . Malaria     hx of in college  . SVT (supraventricular tachycardia)   . Edema   . Fatigue   . GERD (gastroesophageal reflux disease)   . Hyperlipidemia   . Bradycardia with 51 - 60 beats per minute   . Malaria      in college  . Elevated LFTs   . Biliary dyskinesia     Past Surgical History  Procedure Laterality Date  . Cardiac catheterization      SVT in 2000  . Vasectomy      x 2  . Cystoscopy    . Colonoscopy      1998, 200, 2007  . Shoulder arthroscopy      2006  . Colonoscopy  04/01/2011    Procedure: COLONOSCOPY;  Surgeon: Randy Kuba, MD;  [facility]: [facility] ENDOSCOPY;  Service: Endoscopy;  Laterality: N/A;     Current Outpatient Prescriptions  Medication Sig Dispense Refill  . amlodipine-atorvastatin (CADUET) 10-10 MG per tablet Take 1 tablet by mouth daily.    Marland Kitchen aspirin EC 81 MG tablet Take 81 mg by mouth daily.      . Calcium-Magnesium-Vitamin D 600-40-500 MG-MG-UNIT TB24 Take by mouth.    . cholecalciferol (VITAMIN D) 1000 UNITS tablet Take 1,000 Units by mouth daily.     . fish oil-omega-3 fatty acids 1000 MG capsule Take 4 g by mouth daily.     . folic acid (FOLVITE) 400 MCG tablet Take 400 mcg by mouth daily.      . hydrochlorothiazide (HYDRODIURIL) 25 MG tablet  Take 25 mg by mouth daily.      . Multiple Vitamins-Minerals (MULTIVITAMINS THER. W/MINERALS) TABS Take 1 tablet by mouth daily.      Marland Kitchen omeprazole (PRILOSEC) 20 MG capsule Take 20 mg by mouth daily.    . Tadalafil 2.5 MG TABS Take 2.5 mg by mouth as needed.    . valsartan (DIOVAN) 320 MG tablet Take 320 mg by mouth daily.       No current facility-administered medications for this visit.    Allergies:   Review of patient's allergies indicates no known allergies.    Social History:  The patient  reports that he has never smoked. He does not have any smokeless tobacco history on file. He reports that he does not drink alcohol or use illicit drugs.   Family History:  The patient's Mother - HTN, DM. Father - no CAD    ROS:  Please see the history of present illness.   Otherwise, review of systems are positive for none.   All other systems are reviewed and negative.    PHYSICAL EXAM: VS:  BP 145/75 mmHg  Pulse  59  Ht  (1.702 m)  Wt 159 lb (72.122 kg)  BMI 24.90 kg/m2 , BMI Body mass index is 24.9 kg/(m^2). GEN: Well nourished, well developed, in no acute distress HEENT: normal Neck: no JVD, carotid bruits, or masses Cardiac: RRR; soft systolic ejection murmur at right upper sternal border, no rubs, or gallops,no edema  Respiratory:  clear to auscultation bilaterally, normal work of breathing GI: soft, nontender, nondistended, + BS MS: no deformity or atrophy Skin: warm and dry, no rash, trace edema bilaterally currently. Neuro:  Strength and sensation are intact Psych: euthymic mood, full affect   EKG:  EKG is not ordered today. Prior EKG shows sinus bradycardia rate 58 with normal QT interval and borderline left ventricular hypertrophy with borderline nonspecific ST-T segment changes in lead V5 as well as 3.   Recent Labs: No results found for requested labs within last 365 days.    Lipid Panel No results found for: CHOL, TRIG, HDL, CHOLHDL, VLDL, LDLCALC, LDLDIRECT    Wt Readings from Last 3 Encounters:  07/01/14 159 lb (72.122 kg)      Other studies Reviewed: Additional studies/ records that were reviewed today include: Prior echocardiogram, clinic note.   ASSESSMENT AND PLAN:  1.  Unexplained transient fatigue-this reminded him of his previous episode of supraventricular tachycardia. However, he did poke himself up to the blood pressure machine and his heart rate at the time was 58 bpm. His blood pressure was not low. This was transient and did go away after 2-3 hours. He will be having blood work soon, TSH, CBC, complete metabolic profile. This will be helpful. I would like to check an echocardiogram given his soft systolic murmur, possibly aortic sclerosis and when sure that he does not have a degree of left ventricular hypertrophy. I will not order an that monitor at this time however if symptoms return we will consider this. If he does have an episode of frank syncope,  we will certainly consider about implantable loop recorder.  2. Prior supraventricular tachycardia-extensive workup in 2004. Unremarkable. This included echocardiogram, catheterization. No further occurrence. He is no longer on beta blocker, fatigue.  3. Heart murmur-we will check echocardiogram to ensure proper valvular anatomy. He also notices occasional harsh sound in his ear when laying on one side of the pillow. This is likely a vascular phenomenon amplification of his  pulse.  Likely benign. I do not hear any bruits.   Current medicines are reviewed at length with the patient today.  The patient does not have concerns regarding medicines other than edema from amlodipine.  The following changes have been made:  no change  Labs/ tests ordered today include:   Orders Placed This Encounter  Procedures  . 2D Echocardiogram without contrast     Disposition:   FU with ECHO. He will let me know if he has any further symptoms and we will see him at that time.   Mathews RobinsonsSigned, Dyami Umbach, MD  07/01/2014 4:27 PM    [facility] [address], [place], [place]  [address] Phone: [phone]) [phone]; Fax: [phone]

## 2014-07-01 NOTE — Patient Instructions (Signed)
The current medical regimen is effective;  continue present plan and medications.  Your physician has requested that you have an echocardiogram. Echocardiography is a painless test that uses sound waves to create images of your heart. It provides your doctor with information about the size and shape of your heart and how well your heart's chambers and valves are working. This procedure takes approximately one hour. There are no restrictions for this procedure.  Further follow up will be based on these results.  Thank you for choosing [facility]!!     

## 2014-07-03 ENCOUNTER — Other Ambulatory Visit: Payer: Self-pay | Admitting: *Deleted

## 2014-07-03 ENCOUNTER — Ambulatory Visit (HOSPITAL_COMMUNITY): Payer: BLUE CROSS/BLUE SHIELD | Attending: Internal Medicine | Admitting: Radiology

## 2014-07-03 DIAGNOSIS — I471 Supraventricular tachycardia: Secondary | ICD-10-CM

## 2014-07-03 DIAGNOSIS — R011 Cardiac murmur, unspecified: Secondary | ICD-10-CM

## 2014-07-03 NOTE — Progress Notes (Signed)
Echocardiogram performed.  

## 2014-07-07 ENCOUNTER — Ambulatory Visit: Payer: Self-pay | Admitting: Cardiology

## 2014-07-18 ENCOUNTER — Encounter: Payer: Self-pay | Admitting: Radiology

## 2014-07-18 ENCOUNTER — Encounter (INDEPENDENT_AMBULATORY_CARE_PROVIDER_SITE_OTHER): Payer: BLUE CROSS/BLUE SHIELD

## 2014-07-18 DIAGNOSIS — I471 Supraventricular tachycardia: Secondary | ICD-10-CM

## 2014-07-18 NOTE — Progress Notes (Signed)
Patient ID: Randy Frye, male   DOB: 1950-06-02, 64 y.o.   MRN: [id number] [employer] 30 day monitor applied. EOS 08-17-14

## 2014-09-08 ENCOUNTER — Telehealth: Payer: Self-pay | Admitting: *Deleted

## 2014-09-08 NOTE — Telephone Encounter (Signed)
Left message for pt to call back to review results of monitor which demonstrated NSR with occasional bradycardia, no pauses, no arrhythmias.  HR from 48 to 65.

## 2014-09-16 NOTE — Telephone Encounter (Signed)
Follow up  ° ° ° °Returning call back to nurse  °

## 2014-09-16 NOTE — Telephone Encounter (Signed)
Discussed results with pt who states understanding

## 2015-08-20 ENCOUNTER — Other Ambulatory Visit (HOSPITAL_COMMUNITY): Payer: Self-pay | Admitting: Orthopedic Surgery

## 2015-08-26 ENCOUNTER — Other Ambulatory Visit: Payer: Self-pay | Admitting: Orthopedic Surgery

## 2015-08-26 DIAGNOSIS — M533 Sacrococcygeal disorders, not elsewhere classified: Secondary | ICD-10-CM

## 2015-09-01 ENCOUNTER — Ambulatory Visit
Admission: RE | Admit: 2015-09-01 | Discharge: 2015-09-01 | Disposition: A | Discharge status: Home / Self Care | Payer: BLUE CROSS/BLUE SHIELD | Source: Ambulatory Visit | Attending: Orthopedic Surgery | Admitting: Orthopedic Surgery

## 2015-09-01 DIAGNOSIS — M533 Sacrococcygeal disorders, not elsewhere classified: Secondary | ICD-10-CM

## 2015-09-01 MED ORDER — GADOBENATE DIMEGLUMINE 529 MG/ML IV SOLN
13.0000 mL | Freq: Once | INTRAVENOUS | Status: AC | PRN
  Administered 2015-09-01: 13 mL via INTRAVENOUS

## 2015-09-04 ENCOUNTER — Telehealth: Payer: Self-pay | Admitting: Cardiology

## 2015-09-04 NOTE — Telephone Encounter (Signed)
New message     Request for surgical clearance:  1. 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 When is this surgery scheduled? June 2nd  3. Are there any medications that need to be held prior to surgery and how long? Aspirin how long to hold  4. Name of physician performing surgery? Dr. Ophelia CharterYates  5. What is your office phone and fax number? [phone]/[id number] [phone]/fax number [phone]   [employer] [facility] is faxing a form the Md needs to fill for the procedure.

## 2015-09-04 NOTE — Telephone Encounter (Signed)
Will forward to Dr. Anne FuSkains for review and advisement. Pt last seen 06/2014 and follow up was to be based on test results.

## 2015-09-04 NOTE — Telephone Encounter (Signed)
He may proceed with surgery with low cardiac risk.   Donato SchultzMark Skains, MD

## 2015-09-04 NOTE — Telephone Encounter (Signed)
Fax placed in nurse fax box 

## 2016-07-23 IMAGING — MR MR PELVIS WO/W CM
6 of 9 series · 32 of 48 positions shown · IV contrast (multihance)
Comparison: None.

CLINICAL DATA: Coccygeal pain for 2 months. No known injury.
Initial encounter.

EXAM:
MRI PELVIS WITHOUT AND WITH CONTRAST
TECHNIQUE: Multiplanar multisequence MR imaging of the pelvis was performed
both before and after administration of intravenous contrast.
CONTRAST:  13 ml MULTIHANCE GADOBENATE DIMEGLUMINE 529 MG/ML IV SOLN

[Series 3: T1 · sagittal · 4.0mm · 0.55mm/px · 4 of 15 slices shown (1 of 3)]
[im 1/15]
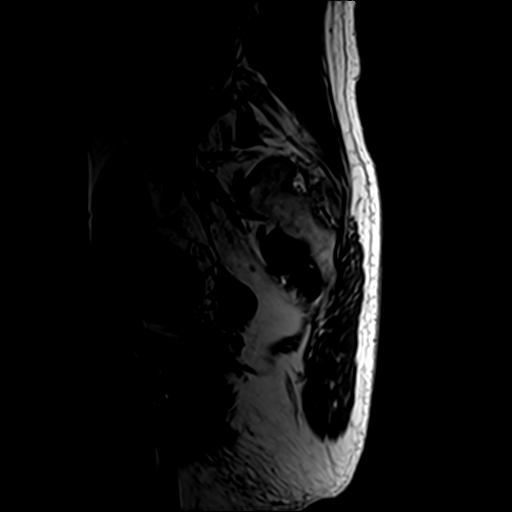
[im 5/15]
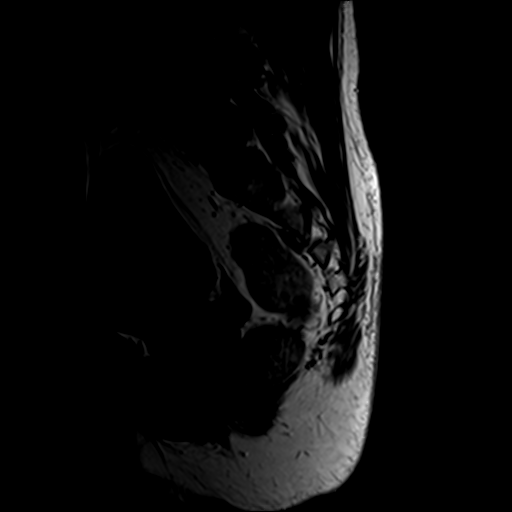
[im 10/15]
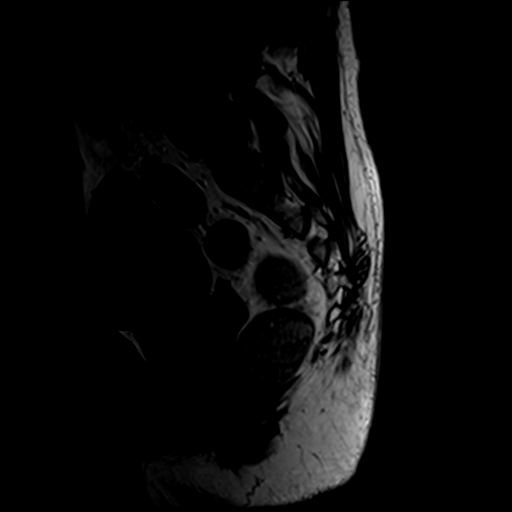
[im 15/15]
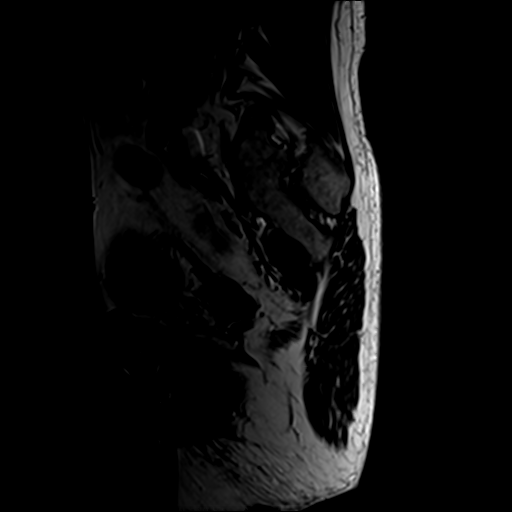

[Series 4: T1 · coronal · 4.0mm · 0.51mm/px · 4 of 15 slices shown (2 of 3)]
[im 1/15]
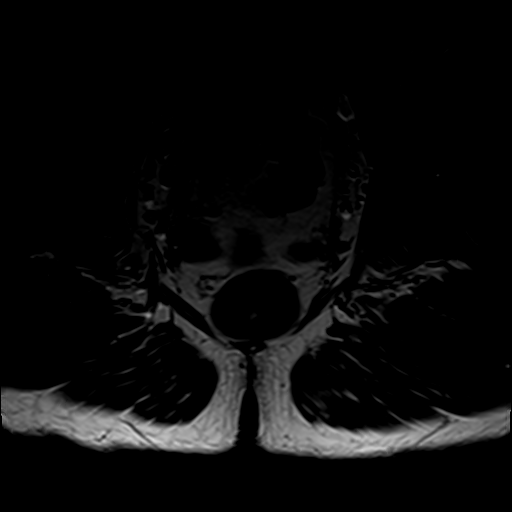
[im 5/15]
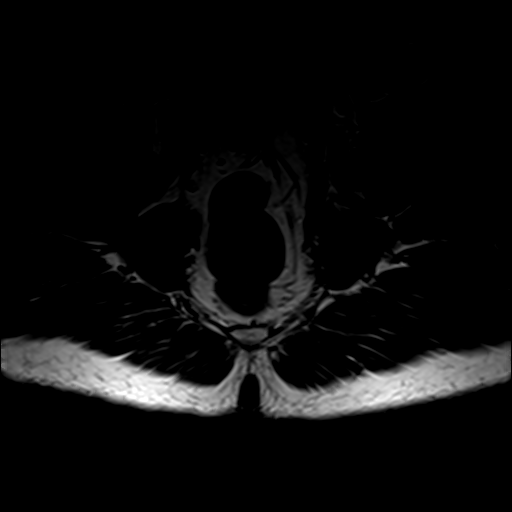
[im 10/15]
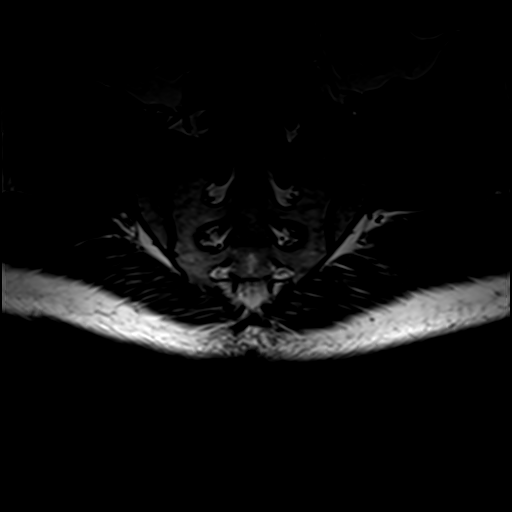
[im 15/15]
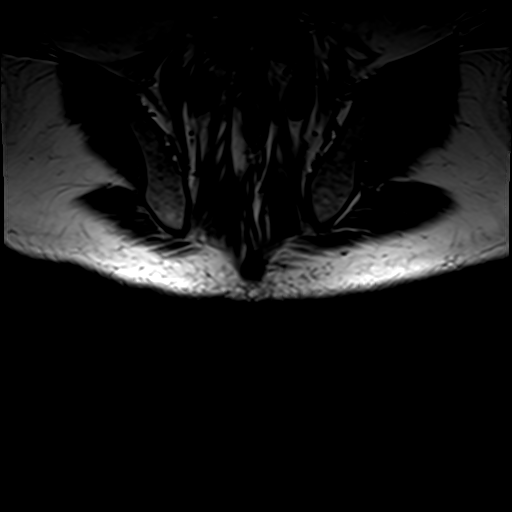

[Series 5: STIR · coronal · 4.0mm · 1.02mm/px · 4 of 15 slices shown]
[im 1/15]
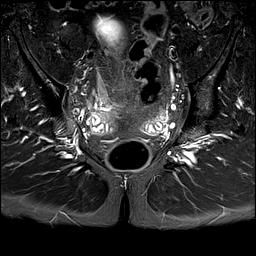
[im 5/15]
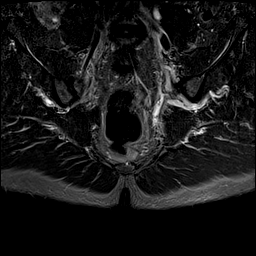
[im 10/15]
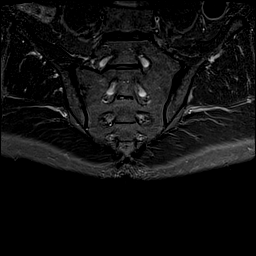
[im 15/15]
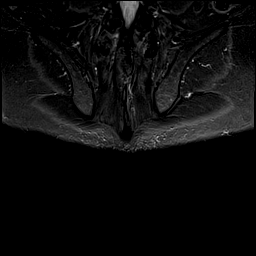

[Series 6: T1 · axial · 5.0mm · 1.02mm/px · z∈[-66,+59]mm · 7 of 23 slices shown (3 of 3)]
[im 1/23]
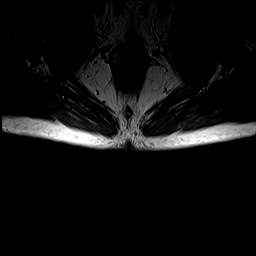
[im 4/23]
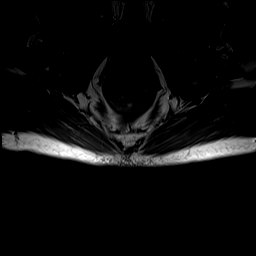
[im 8/23]
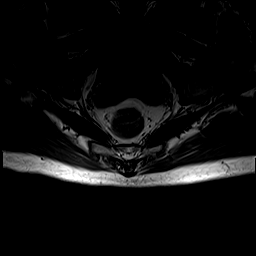
[im 12/23]
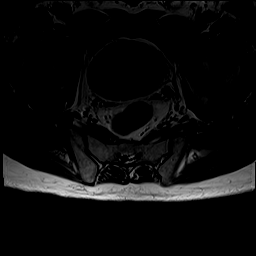
[im 15/23]
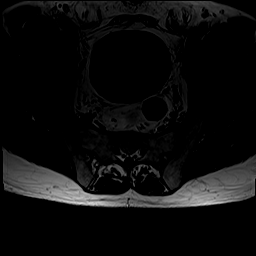
[im 19/23]
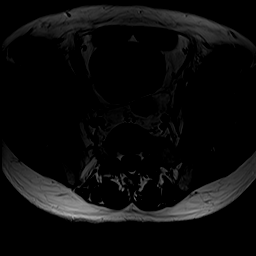
[im 23/23]
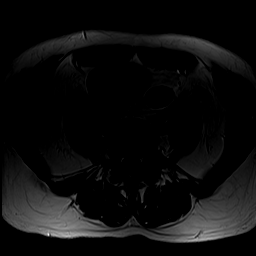

[Series 8: T1 fat-sat · axial · non-contrast · 5.0mm · 1.02mm/px · z∈[-66,+59]mm · 7 of 23 slices shown]
[im 1/23]
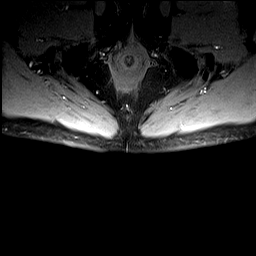
[im 4/23]
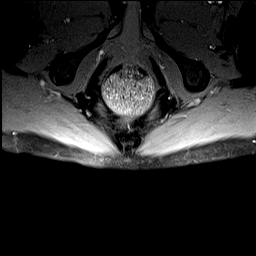
[im 8/23]
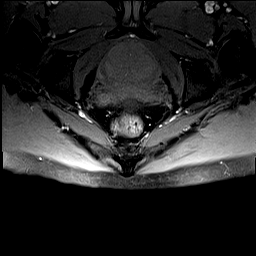
[im 12/23]
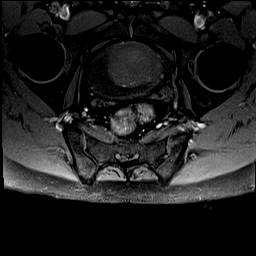
[im 15/23]
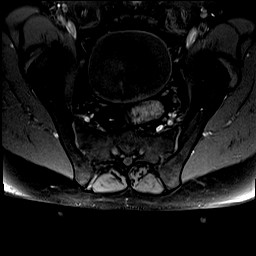
[im 19/23]
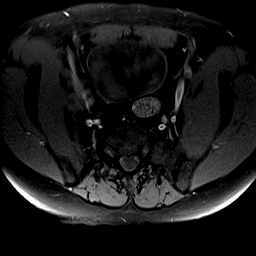
[im 23/23]
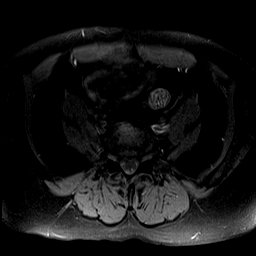

[Series 9: T2 fat-sat · axial · 5.0mm · 0.52mm/px · z∈[-74,+29]mm · 6 of 23 slices shown]
[im 1/23]
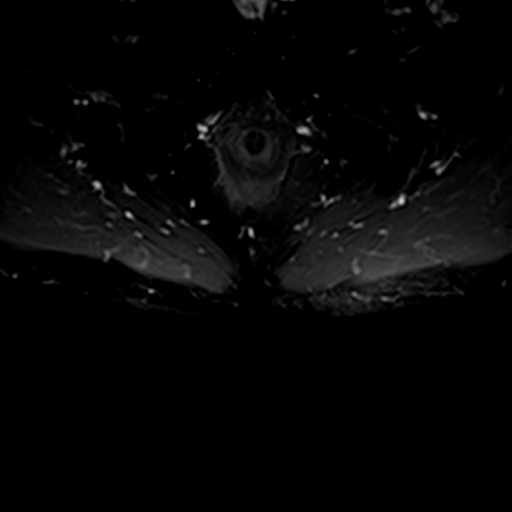
[im 4/23]
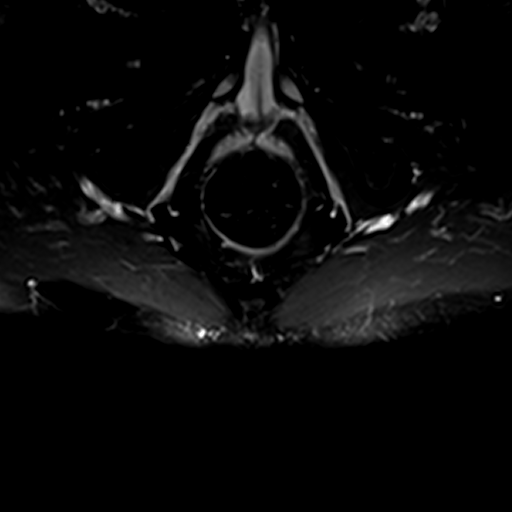
[im 8/23]
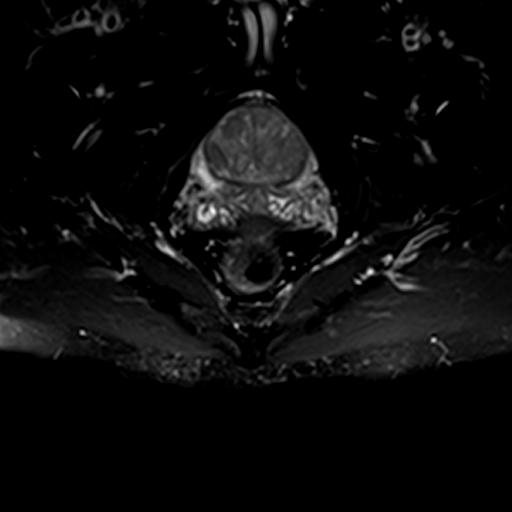
[im 12/23]
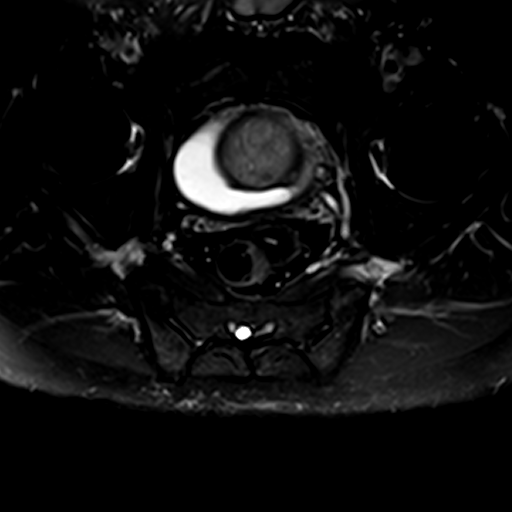
[im 15/23]
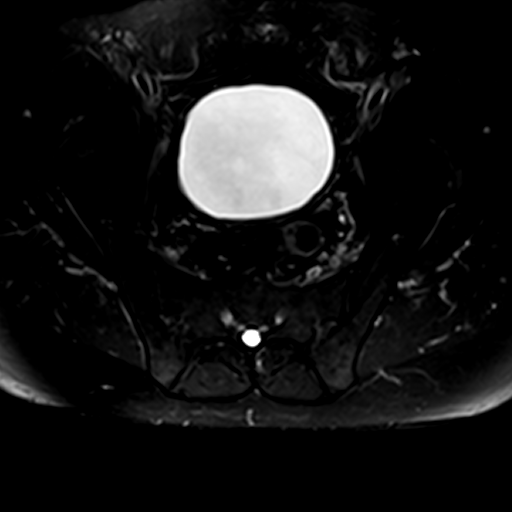
[im 19/23]
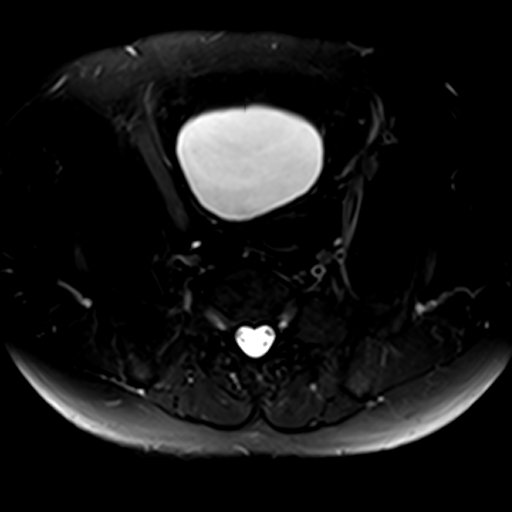

[32 of 48 positions shown; findings below may reference images not displayed]

FINDINGS: Marrow signal in all imaged bones including the coccyx is normal.
Alignment of the visualized lower lumbar spine, sacrum and coccyx is
unremarkable. No fracture is identified. A focus of T2
hyperintensity off the tip of the coccyx measures 0.9 cm AP by
cm transverse. Imaged soft tissue structures are otherwise
unremarkable. The sacral nerve plexus appears normal. Limited
visualization of the hips is normal. Imaged intrapelvic contents
demonstrate a somewhat prominent prostate gland.
IMPRESSION: Normal-appearing coccyx. Tiny focus of T2 hyperintensity off the tip
of the coccyx is nonspecific but could be a venous structure or
possibly a tiny focus of inflammation. No abscess is identified. The
study is otherwise negative.
